# Patient Record
Sex: Female | Born: 1990 | Race: Black or African American | Hispanic: No | Marital: Single | Smoking: Never smoker
Health system: Southern US, Community
[De-identification: ages and names within clinical notes are randomized; demographics above are authoritative.]

## PROBLEM LIST (undated history)

## (undated) HISTORY — PX: HERNIA REPAIR: SHX51

---
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
## 2011-07-18 ENCOUNTER — Emergency Department (HOSPITAL_COMMUNITY): Payer: Managed Care, Other (non HMO)

## 2011-07-18 ENCOUNTER — Encounter (HOSPITAL_COMMUNITY): Payer: Self-pay

## 2011-07-18 ENCOUNTER — Emergency Department (HOSPITAL_COMMUNITY)
Admission: EM | Admit: 2011-07-18 | Discharge: 2011-07-18 | Disposition: A | Discharge status: Home / Self Care | Payer: Managed Care, Other (non HMO) | Attending: Emergency Medicine | Admitting: Emergency Medicine

## 2011-07-18 DIAGNOSIS — M25569 Pain in unspecified knee: Secondary | ICD-10-CM | POA: Insufficient documentation

## 2011-07-18 DIAGNOSIS — R404 Transient alteration of awareness: Secondary | ICD-10-CM | POA: Insufficient documentation

## 2011-07-18 DIAGNOSIS — K089 Disorder of teeth and supporting structures, unspecified: Secondary | ICD-10-CM | POA: Insufficient documentation

## 2011-07-18 DIAGNOSIS — N390 Urinary tract infection, site not specified: Secondary | ICD-10-CM

## 2011-07-18 DIAGNOSIS — M542 Cervicalgia: Secondary | ICD-10-CM | POA: Insufficient documentation

## 2011-07-18 LAB — DIFFERENTIAL
Basophils Absolute: 0 K/uL (ref 0.0–0.1)
Basophils Relative: 0 % (ref 0–1)
Eosinophils Absolute: 0.1 K/uL (ref 0.0–0.7)
Eosinophils Relative: 1 % (ref 0–5)
Lymphocytes Relative: 23 % (ref 12–46)
Lymphs Abs: 2.3 K/uL (ref 0.7–4.0)
Monocytes Absolute: 0.7 K/uL (ref 0.1–1.0)
Monocytes Relative: 7 % (ref 3–12)
Neutro Abs: 7 K/uL (ref 1.7–7.7)
Neutrophils Relative %: 69 % (ref 43–77)

## 2011-07-18 LAB — BASIC METABOLIC PANEL WITH GFR
BUN: 14 mg/dL (ref 6–23)
CO2: 25 meq/L (ref 19–32)
Calcium: 9.6 mg/dL (ref 8.4–10.5)
Chloride: 104 meq/L (ref 96–112)
Creatinine, Ser: 1 mg/dL (ref 0.50–1.10)
GFR calc Af Amer: >90 mL/min
GFR calc non Af Amer: 80 mL/min — ABNORMAL LOW
Glucose, Bld: 107 mg/dL — ABNORMAL HIGH (ref 70–99)
Potassium: 3.8 meq/L (ref 3.5–5.1)
Sodium: 138 meq/L (ref 135–145)

## 2011-07-18 LAB — URINE MICROSCOPIC-ADD ON

## 2011-07-18 LAB — URINALYSIS, ROUTINE W REFLEX MICROSCOPIC
Glucose, UA: NEGATIVE mg/dL
Hgb urine dipstick: NEGATIVE
Protein, ur: NEGATIVE mg/dL

## 2011-07-18 LAB — CBC
HCT: 41.2 % (ref 36.0–46.0)
Hemoglobin: 14 g/dL (ref 12.0–15.0)
MCH: 30.4 pg (ref 26.0–34.0)
MCV: 89.4 fL (ref 78.0–100.0)
RBC: 4.61 MIL/uL (ref 3.87–5.11)

## 2011-07-18 LAB — POCT PREGNANCY, URINE: Preg Test, Ur: NEGATIVE

## 2011-07-18 MED ORDER — IBUPROFEN 800 MG PO TABS: 800.0000 mg | ORAL_TABLET | Freq: Three times a day (TID) | ORAL | Status: AC

## 2011-07-18 MED ORDER — HYDROCODONE-ACETAMINOPHEN 5-325 MG PO TABS: 1.0000 | ORAL_TABLET | Freq: Three times a day (TID) | ORAL | Status: AC | PRN

## 2011-07-18 MED ORDER — SODIUM CHLORIDE 0.9 % IV BOLUS (SEPSIS): 1000.0000 mL | Freq: Once | INTRAVENOUS | Status: DC

## 2011-07-18 MED ORDER — NITROFURANTOIN MONOHYD MACRO 100 MG PO CAPS: 100.0000 mg | ORAL_CAPSULE | Freq: Two times a day (BID) | ORAL | Status: AC
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
## 2011-07-18 NOTE — ED Notes (Signed)
Dr. Jeraldine Loots removed from board, c-collar maintained

## 2011-07-18 NOTE — ED Notes (Signed)
Discharge instructions given  Voiced understanding.   
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
## 2011-07-18 NOTE — ED Notes (Signed)
Patient is resting comfortably. 

## 2013-09-15 ENCOUNTER — Ambulatory Visit (INDEPENDENT_AMBULATORY_CARE_PROVIDER_SITE_OTHER): Payer: BC Managed Care – PPO | Admitting: Family Medicine

## 2013-09-15 VITALS — BP 124/88 | HR 103 | Temp 98.6°F | Resp 20 | Ht 65.5 in | Wt 246.4 lb

## 2013-09-15 DIAGNOSIS — M7918 Myalgia, other site: Secondary | ICD-10-CM

## 2013-09-15 DIAGNOSIS — IMO0001 Reserved for inherently not codable concepts without codable children: Secondary | ICD-10-CM

## 2013-09-15 DIAGNOSIS — L0231 Cutaneous abscess of buttock: Secondary | ICD-10-CM

## 2013-09-15 DIAGNOSIS — L03317 Cellulitis of buttock: Principal | ICD-10-CM

## 2013-09-15 LAB — POCT CBC
GRANULOCYTE PERCENT: 79.9 % (ref 37–80)
HCT, POC: 42.7 % (ref 37.7–47.9)
Hemoglobin: 14.4 g/dL (ref 12.2–16.2)
Lymph, poc: 2 (ref 0.6–3.4)
MCH, POC: 30.8 pg (ref 27–31.2)
MCHC: 33.7 g/dL (ref 31.8–35.4)
MCV: 91.4 fL (ref 80–97)
MID (CBC): 1 — AB (ref 0–0.9)
MPV: 8.1 fL (ref 0–99.8)
PLATELET COUNT, POC: 52 10*3/uL — AB (ref 142–424)
POC GRANULOCYTE: 12.1 — AB (ref 2–6.9)
POC LYMPH %: 13.4 % (ref 10–50)
POC MID %: 6.7 %M (ref 0–12)
RBC: 4.67 M/uL (ref 4.04–5.48)
RDW, POC: 13.3 %
WBC: 15.2 10*3/uL — AB (ref 4.6–10.2)

## 2013-09-15 MED ORDER — CEPHALEXIN 500 MG PO CAPS: 500.0000 mg | ORAL_CAPSULE | Freq: Four times a day (QID) | ORAL | Status: DC

## 2013-09-15 MED ORDER — SULFAMETHOXAZOLE-TRIMETHOPRIM 800-160 MG PO TABS: 2.0000 | ORAL_TABLET | Freq: Two times a day (BID) | ORAL | Status: DC

## 2013-09-15 MED ORDER — CEFTRIAXONE SODIUM 1 G IJ SOLR
1.0000 g | Freq: Once | INTRAMUSCULAR | Status: AC
  Administered 2013-09-15: 1 g via INTRAMUSCULAR

## 2013-09-15 NOTE — Progress Notes (Signed)
Subjective:    Patient ID: Evelyn Black, female    DOB: Jul 21, 1990, 23 y.o.   MRN: [id number]  09/15/2013  Cellulitis   HPI This 23 y.o. female presents for evaluation of boil on buttocks.  Has long hard indurated area on buttocks area; onset yesterday.  No fever/chills/sweats; no myalgias.  No pustules; no draining.  No previous symptoms before.  Warmed a hot towel to area last night; very painful.  No pain with bowel movements; no pain in vaginal area.   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  [place]     Review of Systems  Constitutional: Negative for fever, chills, diaphoresis and fatigue.  Skin: Positive for color change. Negative for pallor, rash and wound.    History reviewed. No pertinent past medical history. Past Surgical History  Procedure Laterality Date  . Hernia repair     No Known Allergies Current Outpatient Prescriptions  Medication Sig Dispense Refill  . cephALEXin (KEFLEX) 500 MG capsule Take 1 capsule (500 mg total) by mouth 4 (four) times daily.  40 capsule  0  . sulfamethoxazole-trimethoprim (BACTRIM DS,SEPTRA DS) 800-160 MG per tablet Take 2 tablets by mouth 2 (two) times daily.  40 tablet  0   No current facility-administered medications for this visit.   History   Social History  . Marital Status: Single    Spouse Name: N/A    Number of Children: N/A  . Years of Education: N/A   Occupational History  . Not on file.   Social History Main Topics  . Smoking status: Never Smoker   . Smokeless tobacco: Never Used  . Alcohol Use: No     Comment: rare  . Drug Use: No  . Sexual Activity: Not on file   Other Topics Concern  . Not on file   Social History Narrative  . No narrative on file        Objective:    BP 124/88  Pulse 103  Temp(Src) 98.6 F (37 C) (Oral)  Resp 20  Ht 5' 5.5" (1.664 m)  Wt 246 lb 6 oz (111.755 kg)  BMI 40.36 kg/m2  SpO2 98%  LMP 09/03/2013 Physical Exam  Nursing note and vitals  reviewed. Constitutional: She is oriented to person, place, and time. She appears well-developed and well-nourished. No distress.  HENT:  Head: Normocephalic and atraumatic.  Eyes: Conjunctivae are normal. Pupils are equal, round, and reactive to light.  Neck: Normal range of motion. Neck supple.  Cardiovascular: Normal rate, regular rhythm and normal heart sounds.  Exam reveals no gallop and no friction rub.   No murmur heard. Pulmonary/Chest: Effort normal and breath sounds normal. She has no wheezes. She has no rales.  Neurological: She is alert and oriented to person, place, and time.  Skin: She is not diaphoretic. There is erythema.     Large indurated area with erythema, warmth along gluteal fold/cleft on L; 10cm x 4 cm.  Psychiatric: She has a normal mood and affect. Her behavior is normal.   ROCEPHIN 1 GRAM IM.  PROCEDURE: SEE SEPARATE PROCEDURE NOTE.    Assessment & Plan:   1. Cellulitis and abscess of buttock   2. Pain in left buttock    1. Pain buttocks L:  New.  Secondary to abscess; recommend OTC Ibuprofen or Tylenol. If needs further pain control, will discuss at follow-up visit. 2.  Cellulitis/abscess L buttocks:  New. S/p I&D with packing.  Wound culture obtained; s/p Rocephin  injection in office; treat with Keflex and Bactrim.  RTC 24 hours to undergo reevaluation by Porfirio Oar, PA-C.  Meds ordered this encounter  Medications  . cefTRIAXone (ROCEPHIN) injection 1 g    Sig:     Order Specific Question:  Antibiotic Indication:    Answer:  Other Indication (list below)    Order Specific Question:  Other Indication:    Answer:  Abscess - Buttock  . cephALEXin (KEFLEX) 500 MG capsule    Sig: Take 1 capsule (500 mg total) by mouth 4 (four) times daily.    Dispense:  40 capsule    Refill:  0  . sulfamethoxazole-trimethoprim (BACTRIM DS,SEPTRA DS) 800-160 MG per tablet    Sig: Take 2 tablets by mouth 2 (two) times daily.    Dispense:  40 tablet    Refill:  0     Return in about 1 day (around 09/16/2013) for recheck with Porfirio Oar, PA-C.    Ethelda Chick MD

## 2013-09-15 NOTE — Progress Notes (Signed)
Verbal Consent Obtained. Local anesthesia with 3 cc of 2% lidocaine plain.  11 blade used to incise the lesion centrally.  Small amount of purulence expressed. Explored cavity with hemostats, but loculations found. Packed with 1/4 inch plain packing.  Cleansed and dressed.

## 2013-09-15 NOTE — Patient Instructions (Addendum)
Abscess An abscess is an infected area that contains a collection of pus and debris.It can occur in almost any part of the body. An abscess is also known as a furuncle or boil. CAUSES  An abscess occurs when tissue gets infected. This can occur from blockage of oil or sweat glands, infection of hair follicles, or a minor injury to the skin. As the body tries to fight the infection, pus collects in the area and creates pressure under the skin. This pressure causes pain. People with weakened immune systems have difficulty fighting infections and get certain abscesses more often.  SYMPTOMS Usually an abscess develops on the skin and becomes a painful mass that is red, warm, and tender. If the abscess forms under the skin, you may feel a moveable soft area under the skin. Some abscesses break open (rupture) on their own, but most will continue to get worse without care. The infection can spread deeper into the body and eventually into the bloodstream, causing you to feel ill.  DIAGNOSIS  Your caregiver will take your medical history and perform a physical exam. A sample of fluid may also be taken from the abscess to determine what is causing your infection. TREATMENT  Your caregiver may prescribe antibiotic medicines to fight the infection. However, taking antibiotics alone usually does not cure an abscess. Your caregiver may need to make a small cut (incision) in the abscess to drain the pus. In some cases, gauze is packed into the abscess to reduce pain and to continue draining the area. HOME CARE INSTRUCTIONS   Only take over-the-counter or prescription medicines for pain, discomfort, or fever as directed by your caregiver.  If you were prescribed antibiotics, take them as directed. Finish them even if you start to feel better.  If gauze is used, follow your caregiver's directions for changing the gauze.  To avoid spreading the infection:  Keep your draining abscess covered with a  bandage.  Wash your hands well.  Do not share personal care items, towels, or whirlpools with others.  Avoid skin contact with others.  Keep your skin and clothes clean around the abscess.  Keep all follow-up appointments as directed by your caregiver. SEEK MEDICAL CARE IF:   You have increased pain, swelling, redness, fluid drainage, or bleeding.  You have muscle aches, chills, or a general ill feeling.  You have a fever. MAKE SURE YOU:   Understand these instructions.  Will watch your condition.  Will get help right away if you are not doing well or get worse. Document Released: 11/18/2004 Document Revised: 08/10/2011 Document Reviewed: 04/23/2011 [facility] Patient Information 2015 [place]. This information is not intended to replace advice given to you by your health care provider. Make sure you discuss any questions you have with your health care provider.  Take the antibiotic as prescribed. Apply a warm compress to the area for 15-20 minutes 2-4 times each day. Change the dressing if it becomes saturated, leaks or falls off.

## 2013-09-16 ENCOUNTER — Ambulatory Visit (INDEPENDENT_AMBULATORY_CARE_PROVIDER_SITE_OTHER): Payer: BC Managed Care – PPO | Admitting: Physician Assistant

## 2013-09-16 VITALS — BP 120/80 | HR 103 | Temp 98.8°F | Resp 18 | Ht 66.5 in | Wt 239.0 lb

## 2013-09-16 DIAGNOSIS — IMO0001 Reserved for inherently not codable concepts without codable children: Secondary | ICD-10-CM

## 2013-09-16 DIAGNOSIS — L0231 Cutaneous abscess of buttock: Secondary | ICD-10-CM

## 2013-09-16 DIAGNOSIS — M7918 Myalgia, other site: Secondary | ICD-10-CM

## 2013-09-16 DIAGNOSIS — L03317 Cellulitis of buttock: Principal | ICD-10-CM

## 2013-09-16 MED ORDER — HYDROCODONE-ACETAMINOPHEN 5-325 MG PO TABS: 1.0000 | ORAL_TABLET | Freq: Four times a day (QID) | ORAL | Status: AC | PRN

## 2013-09-16 NOTE — Patient Instructions (Signed)
Continue the antibiotic as prescribed. Apply a warm compress to the area for 15-20 minutes 2-4 times each day. Change the dressing if it becomes saturated, leaks or falls off. Stay off your bottom as much as possible.  The ideal position is lying on your stomach.

## 2013-09-17 ENCOUNTER — Ambulatory Visit (INDEPENDENT_AMBULATORY_CARE_PROVIDER_SITE_OTHER): Payer: BC Managed Care – PPO | Admitting: Physician Assistant

## 2013-09-17 VITALS — BP 128/74 | HR 111 | Temp 98.9°F | Resp 18 | Ht 65.0 in | Wt 246.0 lb

## 2013-09-17 DIAGNOSIS — L03317 Cellulitis of buttock: Principal | ICD-10-CM

## 2013-09-17 DIAGNOSIS — L0231 Cutaneous abscess of buttock: Secondary | ICD-10-CM

## 2013-09-17 NOTE — Progress Notes (Signed)
   Subjective:    Patient ID: Evelyn Black, female    DOB: 1990/06/26, 23 y.o.   MRN: [id number]  HPI  Evelyn Black presents for follow-up after I&D of a buttock abscess yesterday.  She received a dose of Rocephin and was started on both Keflex and Septra DS pending the culture results.  Minimal change in her pain.  No fever, chills.  Tolerating the meds without difficulty.   Review of Systems     Objective:   Physical Exam  BP 120/80  Pulse 103  Temp(Src) 98.8 F (37.1 C)  Resp 18  Ht 5' 6.5" (1.689 m)  Wt 239 lb (108.41 kg)  BMI 38.00 kg/m2  SpO2 98%  LMP 09/03/2013 WDWNBF, A&O x 3. Intact dressing removed.  Packing removed.  Moderate purulence expressed from deep within the wound.  Induration persists, but erythematous area is minimal. Quite tender. Wound cavity irrigated with 5 cc 2% lidocaine plain and gently repacked. Dressing placed.      Assessment & Plan:  1. Cellulitis and abscess of buttock 2. Pain in left buttock Continue current antibiotics pending culture results.  Pain meds prescribed at her request (she declined yesterday).  Needs to avoid sitting and reduce standing.  Apply heat.  - HYDROcodone-acetaminophen (NORCO) 5-325 MG per tablet; Take 1 tablet by mouth every 6 (six) hours as needed.  Dispense: 30 tablet; Refill: 0  Return in about 1 day (around 09/17/2013) for wound care.  Fernande Brashelle S. Wiley Flicker, PA-C Physician Assistant-Certified [facility]

## 2013-09-17 NOTE — Progress Notes (Signed)
Patient ID: Renato ShinBriona Gilardi MRN: [id number], DOB: 1990/08/09 23 y.o. Date of Encounter: 09/17/2013, 1:34 PM  Chief Complaint: Wound care   See previous note  HPI: 23 y.o. y/o female presents for wound care s/p I&D on 09/15/13.  Doing well No issues or complaints Afebrile/ no chills No nausea or vomiting Tolerating Septra and Keflex.  Has been taking Norco as directed which has helped with her pain.  Pain improved somewhat.  Daily dressing change Previous note reviewed  No past medical history on file.   Home Meds: Prior to Admission medications   Medication Sig Start Date End Date Taking? Authorizing Provider  cephALEXin (KEFLEX) 500 MG capsule Take 1 capsule (500 mg total) by mouth 4 (four) times daily. 09/15/13  Yes Ethelda ChickKristi M Smith, MD  HYDROcodone-acetaminophen (NORCO) 5-325 MG per tablet Take 1 tablet by mouth every 6 (six) hours as needed. 09/16/13  Yes Chelle S Jeffery, PA-C  sulfamethoxazole-trimethoprim (BACTRIM DS,SEPTRA DS) 800-160 MG per tablet Take 2 tablets by mouth 2 (two) times daily. 09/15/13  Yes Ethelda ChickKristi M Smith, MD    Allergies: No Known Allergies  ROS: Constitutional: Afebrile, no chills Cardiovascular: negative for chest pain or palpitations Dermatological: Positive for wound. Negative for erythema, pain, or warmth  GI: No nausea or vomiting   EXAM: Physical Exam: Blood pressure 128/74, pulse 111, temperature 98.9 F (37.2 C), temperature source Oral, resp. rate 18, height 5\' 5"  (1.651 m), weight 246 lb (111.585 kg), last menstrual period 09/03/2013, SpO2 96.00%., Body mass index is 40.94 kg/(m^2). General: Well developed, well nourished, in no acute distress. Nontoxic appearing. Head: Normocephalic, atraumatic, sclera non-icteric.  Neck: Supple. Lungs: Breathing is unlabored. Heart: Normal rate. Skin:  Warm and moist. Dressing and packing in place. Slight surrounding induration. No erythema, or tenderness to palpation. Neuro: Alert and oriented X 3. Moves  all extremities spontaneously. Normal gait.  Psych:  Responds to questions appropriately with a normal affect.       PROCEDURE: Dressing and packing removed. No purulence expressed Wound bed healthy Irrigated with 1% plain lidocaine 5 cc. Repacked with 1/4 plain packing.  Dressing applied  LAB: Culture: in process  A/P: 23 y.o. y/o female with buttocks cellulitis/abscess as above s/p I&D on 09/15/13.  Wound care per above Continue Keflex and Bactrim DS Pain well controlled Daily dressing changes Recheck 48 hours  Signed, Rhoderick MoodyHeather Avyukth Bontempo, PA-C 09/17/2013 1:34 PM

## 2013-09-18 LAB — WOUND CULTURE

## 2013-09-19 ENCOUNTER — Ambulatory Visit (INDEPENDENT_AMBULATORY_CARE_PROVIDER_SITE_OTHER): Payer: BC Managed Care – PPO | Admitting: Physician Assistant

## 2013-09-19 VITALS — BP 118/72 | HR 90 | Temp 98.3°F | Resp 18 | Ht 66.0 in | Wt 248.0 lb

## 2013-09-19 DIAGNOSIS — IMO0001 Reserved for inherently not codable concepts without codable children: Secondary | ICD-10-CM

## 2013-09-19 DIAGNOSIS — L0231 Cutaneous abscess of buttock: Secondary | ICD-10-CM

## 2013-09-19 DIAGNOSIS — L03317 Cellulitis of buttock: Principal | ICD-10-CM

## 2013-09-19 DIAGNOSIS — M7918 Myalgia, other site: Secondary | ICD-10-CM

## 2013-09-19 NOTE — Progress Notes (Signed)
   Subjective:    Patient ID: Renato ShinBriona Morton, female    DOB: [birth year], 23 y.o.   MRN: [id number]  HPI  Philmore PaliBriona presents for wound care, s/p I&D of a buttock abscess on 09/15/2013.  This is her third visit for follow-up and she is doing well, having less pain, able to sit and walk much more comfortably.  She continues to tolerate the antibiotics without difficulty.  The packing fell out with a dressing change yesterday, so she's worried.  No fever, chills.  Review of Systems     Objective:   Physical Exam  Dressing removed.  No packing in place. No erythema.  No edema.  Significantly less induration. No purulence expressed, though a "glob" of yellow material removed with forceps. Irrigated wound with 3 cc 2% lidocaine. Wound depth probed, only 1.5 cm. Gently repacked with 1/4 inch plain packing.  Dressed.      Assessment & Plan:  1. Cellulitis and abscess of buttock 2. Pain in left buttock Continue antibiotics as prescribed and PRN pain medication.  Continue to avoid sitting and minimize walking. Continue warm compresses and daily plus PRN dressing changes. RTC in 48 hours for wound care.   Fernande Brashelle S. [place] Lingenfelter, PA-C Physician Assistant-Certified [facility]

## 2013-09-21 ENCOUNTER — Ambulatory Visit (INDEPENDENT_AMBULATORY_CARE_PROVIDER_SITE_OTHER): Payer: BC Managed Care – PPO | Admitting: Physician Assistant

## 2013-09-21 DIAGNOSIS — L0231 Cutaneous abscess of buttock: Secondary | ICD-10-CM

## 2013-09-21 DIAGNOSIS — L03317 Cellulitis of buttock: Principal | ICD-10-CM

## 2013-09-21 NOTE — Progress Notes (Signed)
Patient ID: Evelyn Black MRN: [id number], DOB: Aug 03, 1990 23 y.o. Date of Encounter: 09/21/2013, 5:38 PM  Chief Complaint: Wound care   See previous note  HPI: 23 y.o. y/o female presents for wound care s/p I&D on 09/15/13.  Doing well No issues or complaints Afebrile/ no chills No nausea or vomiting Tolerating Septra.  Has dc'ed Keflex due to cx shows MRSA Pain resolved.  Daily dressing change Previous note reviewed  No past medical history on file.   Home Meds: Prior to Admission medications   Medication Sig Start Date End Date Taking? Authorizing Provider  cephALEXin (KEFLEX) 500 MG capsule Take 1 capsule (500 mg total) by mouth 4 (four) times daily. 09/15/13   Ethelda ChickKristi M Smith, MD  HYDROcodone-acetaminophen (NORCO) 5-325 MG per tablet Take 1 tablet by mouth every 6 (six) hours as needed. 09/16/13   Chelle S Jeffery, PA-C  sulfamethoxazole-trimethoprim (BACTRIM DS,SEPTRA DS) 800-160 MG per tablet Take 2 tablets by mouth 2 (two) times daily. 09/15/13   Ethelda ChickKristi M Smith, MD    Allergies: No Known Allergies  ROS: Constitutional: Afebrile, no chills Cardiovascular: negative for chest pain or palpitations Dermatological: Positive for wound. Negative for erythema, pain, or warmth  GI: No nausea or vomiting   EXAM: Physical Exam: Last menstrual period 09/03/2013., There is no weight on file to calculate BMI. General: Well developed, well nourished, in no acute distress. Nontoxic appearing. Head: Normocephalic, atraumatic, sclera non-icteric.  Neck: Supple. Lungs: Breathing is unlabored. Heart: Normal rate. Skin:  Warm and moist. Dressing and packing in place. No induration, erythema, or tenderness to palpation. Neuro: Alert and oriented X 3. Moves all extremities spontaneously. Normal gait.  Psych:  Responds to questions appropriately with a normal affect.       PROCEDURE: Dressing removed. No purulence expressed Wound bed healthy Irrigated with 1% plain lidocaine 5  cc. Not repacked.  Dressing applied  LAB: Culture: MRSA  A/P: 23 y.o. y/o female with buttocks cellulitis/abscess as above s/p I&D on 09/15/13.  Wound care per above Continue Septra.  Pain well controlled Daily dressing changes Recheck as needed  Signed, Rhoderick MoodyHeather Azlin Zilberman, PA-C 09/21/2013 5:38 PM
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
## 2013-11-26 ENCOUNTER — Ambulatory Visit (INDEPENDENT_AMBULATORY_CARE_PROVIDER_SITE_OTHER): Payer: BC Managed Care – PPO | Admitting: Family Medicine

## 2013-11-26 VITALS — BP 110/70 | HR 86 | Temp 98.0°F | Resp 16 | Ht 66.0 in | Wt 248.0 lb

## 2013-11-26 DIAGNOSIS — Z131 Encounter for screening for diabetes mellitus: Secondary | ICD-10-CM

## 2013-11-26 DIAGNOSIS — R112 Nausea with vomiting, unspecified: Secondary | ICD-10-CM

## 2013-11-26 DIAGNOSIS — K219 Gastro-esophageal reflux disease without esophagitis: Secondary | ICD-10-CM

## 2013-11-26 LAB — POCT GLYCOSYLATED HEMOGLOBIN (HGB A1C): Hemoglobin A1C: 5.7

## 2013-11-26 MED ORDER — OMEPRAZOLE 20 MG PO CPDR: DELAYED_RELEASE_CAPSULE | ORAL | Status: AC

## 2013-11-26 NOTE — Progress Notes (Signed)
Chief Complaint:  Chief Complaint  Patient presents with  . Gastrophageal Reflux    Getting worse the past few months    HPI: Evelyn Black is a 23 y.o. female who is here for reflux sxs, they are gettign worse: burning in throat, nausea, and unable to produce burps after meals. This has been going on for past year, has tried tums but has no relief. Has not eaten any spicy foods and continues to have issues. Has no past history of GI issues. Nausea for several weeks, she denies DM. She states that she wakes up and feels liek she ahs to throw up and mostly stomach acid, then feels better, then she ahs breakfast lunch and would feel sick before and after she eats. She is not pregnant. Currently on period, LMP was Thursday. Does not matter which foods she eats, stillfeels the same whether greasy or bland. She works at [employer] but does nto eat the food. She is always on the go. She does not eat late oso biggest meal is usually at lunch. Has tried TUMs with minimal releif.   History reviewed. No pertinent past medical history. Past Surgical History  Procedure Laterality Date  . Hernia repair     History   Social History  . Marital Status: Single    Spouse Name: N/A    Number of Children: N/A  . Years of Education: N/A   Social History Main Topics  . Smoking status: Never Smoker   . Smokeless tobacco: Never Used  . Alcohol Use: No     Comment: rare  . Drug Use: No  . Sexual Activity: None   Other Topics Concern  . None   Social History Narrative  . None   Family History  Problem Relation Age of Onset  . Hyperlipidemia Mother   . Diabetes Father   . Hyperlipidemia Maternal Grandmother   . Diabetes Maternal Grandfather    No Known Allergies Prior to Admission medications   Medication Sig Start Date End Date Taking? Authorizing Provider  HYDROcodone-acetaminophen (NORCO) 5-325 MG per tablet Take 1 tablet by mouth every 6 (six) hours as needed. 09/16/13   Chelle S  Jeffery, PA-C     ROS: The patient denies fevers, chills, night sweats, unintentional weight loss, chest pain, palpitations, wheezing, dyspnea on exertion, nausea, vomiting, abdominal pain, dysuria, hematuria, melena, numbness, weakness, or tingling.   All other systems have been reviewed and were otherwise negative with the exception of those mentioned in the HPI and as above.    PHYSICAL EXAM: Filed Vitals:   11/26/13 1408  BP: 110/70  Pulse: 86  Temp: 98 F (36.7 C)  Resp: 16   Filed Vitals:   11/26/13 1408  Height: 5\' 6"  (1.676 m)  Weight: 248 lb (112.492 kg)   Body mass index is 40.05 kg/(m^2).  General: Alert, no acute distress, obese femal HEENT:  Normocephalic, atraumatic, oropharynx patent. EOMI, PERRLA, no exudates. No erythema, no malodorus smells from OP Cardiovascular:  Regular rate and rhythm, no rubs murmurs or gallops.  No Carotid bruits, radial pulse intact. No pedal edema.  Respiratory: Clear to auscultation bilaterally.  No wheezes, rales, or rhonchi.  No cyanosis, no use of accessory musculature GI: No organomegaly, abdomen is soft and non-tender, positive bowel sounds.  No masses. Skin: No rashes. Neurologic: Facial musculature symmetric. Psychiatric: Patient is appropriate throughout our interaction. Lymphatic: No cervical lymphadenopathy Musculoskeletal: Gait intact.   LABS: Results for orders placed in visit  on 11/26/13  POCT GLYCOSYLATED HEMOGLOBIN (HGB A1C)      Result Value Ref Range   Hemoglobin A1C 5.7       EKG/XRAY:   Primary [facility]   ASSESSMENT/PLAN: Encounter Diagnoses  Name Primary?  . Gastroesophageal reflux disease without esophagitis Yes  . Nausea and vomiting, vomiting of unspecified type   . Screening for diabetes mellitus    Obese AA female who is her for what sound slike GERD sxs. Less Likely GB in origin due to lack of abd pain.  Prilosec 20 mg daily, if no improvement then try 20 mg BID I  gave her samples of Nexium, she can try and see how it works GERD sxs and foods given to patient F/u prn in 1 month if needed  Gross sideeffects, risk and benefits, and alternatives of medications d/w patient. Patient is aware that all medications have potential sideeffects and we are unable to predict every sideeffect or drug-drug interaction that may occur.  LE, THAO PHUONG, DO 11/27/2013 1:36 PM

## 2013-11-26 NOTE — Patient Instructions (Signed)
Gastroesophageal Reflux Disease, Adult  Gastroesophageal reflux disease (GERD) happens when acid from your stomach flows up into the esophagus. When acid comes in contact with the esophagus, the acid causes soreness (inflammation) in the esophagus. Over time, GERD may create small holes (ulcers) in the lining of the esophagus.  CAUSES   · Increased body weight. This puts pressure on the stomach, making acid rise from the stomach into the esophagus.  · Smoking. This increases acid production in the stomach.  · Drinking alcohol. This causes decreased pressure in the lower esophageal sphincter (valve or ring of muscle between the esophagus and stomach), allowing acid from the stomach into the esophagus.  · Late evening meals and a full stomach. This increases pressure and acid production in the stomach.  · A malformed lower esophageal sphincter.  Sometimes, no cause is found.  SYMPTOMS   · Burning pain in the lower part of the mid-chest behind the breastbone and in the mid-stomach area. This may occur twice a week or more often.  · Trouble swallowing.  · Sore throat.  · Dry cough.  · Asthma-like symptoms including chest tightness, shortness of breath, or wheezing.  DIAGNOSIS   Your caregiver may be able to diagnose GERD based on your symptoms. In some cases, X-rays and other tests may be done to check for complications or to check the condition of your stomach and esophagus.  TREATMENT   Your caregiver may recommend over-the-counter or prescription medicines to help decrease acid production. Ask your caregiver before starting or adding any new medicines.   HOME CARE INSTRUCTIONS   · Change the factors that you can control. Ask your caregiver for guidance concerning weight loss, quitting smoking, and alcohol consumption.  · Avoid foods and drinks that make your symptoms worse, such as:  ¨ Caffeine or alcoholic drinks.  ¨ Chocolate.  ¨ Peppermint or mint flavorings.  ¨ Garlic and onions.  ¨ Spicy foods.  ¨ Citrus fruits,  such as oranges, lemons, or limes.  ¨ Tomato-based foods such as sauce, chili, salsa, and pizza.  ¨ Fried and fatty foods.  · Avoid lying down for the 3 hours prior to your bedtime or prior to taking a nap.  · Eat small, frequent meals instead of large meals.  · Wear loose-fitting clothing. Do not wear anything tight around your waist that causes pressure on your stomach.  · Raise the head of your bed 6 to 8 inches with wood blocks to help you sleep. Extra pillows will not help.  · Only take over-the-counter or prescription medicines for pain, discomfort, or fever as directed by your caregiver.  · Do not take aspirin, ibuprofen, or other nonsteroidal anti-inflammatory drugs (NSAIDs).  SEEK IMMEDIATE MEDICAL CARE IF:   · You have pain in your arms, neck, jaw, teeth, or back.  · Your pain increases or changes in intensity or duration.  · You develop nausea, vomiting, or sweating (diaphoresis).  · You develop shortness of breath, or you faint.  · Your vomit is green, yellow, black, or looks like coffee grounds or blood.  · Your stool is red, bloody, or black.  These symptoms could be signs of other problems, such as heart disease, gastric bleeding, or esophageal bleeding.  MAKE SURE YOU:   · Understand these instructions.  · Will watch your condition.  · Will get help right away if you are not doing well or get worse.  Document Released: 11/18/2004 Document Revised: 05/03/2011 Document Reviewed: 08/28/2010  Exit[employer]® Patient   Information ©2015 [employer]. This information is not intended to replace advice given to you by your health care provider. Make sure you discuss any questions you have with your health care provider.  Food Choices for Gastroesophageal Reflux Disease  When you have gastroesophageal reflux disease (GERD), the foods you eat and your eating habits are very important. Choosing the right foods can help ease the discomfort of GERD.  WHAT GENERAL GUIDELINES DO I NEED TO FOLLOW?  · Choose fruits,  vegetables, whole grains, low-fat dairy products, and low-fat meat, fish, and poultry.  · Limit fats such as oils, salad dressings, butter, nuts, and avocado.  · Keep a food diary to identify foods that cause symptoms.  · Avoid foods that cause reflux. These may be different for different people.  · Eat frequent small meals instead of three large meals each day.  · Eat your meals slowly, in a relaxed setting.  · Limit fried foods.  · Cook foods using methods other than frying.  · Avoid drinking alcohol.  · Avoid drinking large amounts of liquids with your meals.  · Avoid bending over or lying down until 2-3 hours after eating.  WHAT FOODS ARE NOT RECOMMENDED?  The following are some foods and drinks that may worsen your symptoms:  Vegetables  Tomatoes. Tomato juice. Tomato and spaghetti sauce. Chili peppers. Onion and garlic. Horseradish.  Fruits  Oranges, grapefruit, and lemon (fruit and juice).  Meats  High-fat meats, fish, and poultry. This includes hot dogs, ribs, ham, sausage, salami, and bacon.  Dairy  Whole milk and chocolate milk. Sour cream. Cream. Butter. Ice cream. Cream cheese.   Beverages  Coffee and tea, with or without caffeine. Carbonated beverages or energy drinks.  Condiments  Hot sauce. Barbecue sauce.   Sweets/Desserts  Chocolate and cocoa. Donuts. Peppermint and spearmint.  Fats and Oils  High-fat foods, including French fries and potato chips.  Other  Vinegar. Strong spices, such as black pepper, white pepper, red pepper, cayenne, curry powder, cloves, ginger, and chili powder.  The items listed above may not be a complete list of foods and beverages to avoid. Contact your dietitian for more information.  Document Released: 02/08/2005 Document Revised: 02/13/2013 Document Reviewed: 12/13/2012  Exit[employer]® Patient Information ©2015 [employer]. This information is not intended to replace advice given to you by your health care provider. Make sure you discuss any questions you have with your  health care provider.
